# Patient Record
Sex: Female | Born: 1965 | Race: White | Hispanic: No | Marital: Married | Smoking: Former smoker
Health system: Southern US, Community
[De-identification: ages and names within clinical notes are randomized; demographics above are authoritative.]

## PROBLEM LIST (undated history)

## (undated) DIAGNOSIS — M1990 Unspecified osteoarthritis, unspecified site: Secondary | ICD-10-CM

## (undated) DIAGNOSIS — E119 Type 2 diabetes mellitus without complications: Secondary | ICD-10-CM

## (undated) DIAGNOSIS — G43909 Migraine, unspecified, not intractable, without status migrainosus: Secondary | ICD-10-CM

## (undated) DIAGNOSIS — M549 Dorsalgia, unspecified: Secondary | ICD-10-CM

## (undated) DIAGNOSIS — M542 Cervicalgia: Secondary | ICD-10-CM

## (undated) DIAGNOSIS — I10 Essential (primary) hypertension: Secondary | ICD-10-CM

## (undated) HISTORY — PX: ABDOMINAL HYSTERECTOMY: SHX81

## (undated) HISTORY — PX: APPENDECTOMY: SHX54

## (undated) HISTORY — PX: KNEE SURGERY: SHX244

## (undated) HISTORY — PX: OTHER SURGICAL HISTORY: SHX169

## (undated) HISTORY — PX: EXTERNAL EAR SURGERY: SHX627

## (undated) HISTORY — PX: KNEE RECONSTRUCTION: SHX5883

---

## 2014-12-20 ENCOUNTER — Encounter: Payer: Self-pay | Admitting: Emergency Medicine

## 2014-12-20 ENCOUNTER — Ambulatory Visit
Admission: EM | Admit: 2014-12-20 | Discharge: 2014-12-20 | Disposition: A | Discharge status: Home / Self Care | Payer: Medicare Other | Attending: Physician Assistant | Admitting: Physician Assistant

## 2014-12-20 ENCOUNTER — Ambulatory Visit: Payer: Medicare Other

## 2014-12-20 DIAGNOSIS — X58XXXA Exposure to other specified factors, initial encounter: Secondary | ICD-10-CM | POA: Diagnosis not present

## 2014-12-20 DIAGNOSIS — I10 Essential (primary) hypertension: Secondary | ICD-10-CM | POA: Diagnosis not present

## 2014-12-20 DIAGNOSIS — S63502A Unspecified sprain of left wrist, initial encounter: Secondary | ICD-10-CM | POA: Diagnosis not present

## 2014-12-20 DIAGNOSIS — Z79899 Other long term (current) drug therapy: Secondary | ICD-10-CM | POA: Insufficient documentation

## 2014-12-20 DIAGNOSIS — Z87891 Personal history of nicotine dependence: Secondary | ICD-10-CM | POA: Diagnosis not present

## 2014-12-20 DIAGNOSIS — E119 Type 2 diabetes mellitus without complications: Secondary | ICD-10-CM | POA: Diagnosis not present

## 2014-12-20 DIAGNOSIS — M25532 Pain in left wrist: Secondary | ICD-10-CM | POA: Diagnosis present

## 2014-12-20 DIAGNOSIS — Z794 Long term (current) use of insulin: Secondary | ICD-10-CM | POA: Insufficient documentation

## 2014-12-20 HISTORY — DX: Unspecified osteoarthritis, unspecified site: M19.90

## 2014-12-20 HISTORY — DX: Essential (primary) hypertension: I10

## 2014-12-20 HISTORY — DX: Migraine, unspecified, not intractable, without status migrainosus: G43.909

## 2014-12-20 HISTORY — DX: Cervicalgia: M54.2

## 2014-12-20 HISTORY — DX: Type 2 diabetes mellitus without complications: E11.9

## 2014-12-20 HISTORY — DX: Dorsalgia, unspecified: M54.9

## 2014-12-20 NOTE — Discharge Instructions (Signed)
Continuous velcro wrist wrap while active---off at rest and to sleep Wear wrap while traveling and handling suitcases Ice pak/frozen peas as discussed  Range of motion exercises fingers and hand  Wrist Pain There are many things that can cause wrist pain. Some common causes include:  An injury to the wrist area, such as a sprain, strain, or fracture.  Overuse of the joint.  A condition that causes increased pressure on a nerve in the wrist (carpal tunnel syndrome).  Wear and tear of the joints that occurs with aging (osteoarthritis).  A variety of other types of arthritis. Sometimes, the cause of wrist pain is not known. The pain often goes away when you follow your health care provider's instructions for relieving pain at home. If your wrist pain continues, tests may need to be done to diagnose your condition. HOME CARE INSTRUCTIONS Pay attention to any changes in your symptoms. Take these actions to help with your pain:  Rest the wrist area for at least 48 hours or as told by your health care provider.  If directed, apply ice to the injured area:  Put ice in a plastic bag.  Place a towel between your skin and the bag.  Leave the ice on for 20 minutes, 2-3 times per day.  Keep your arm raised (elevated) above the level of your heart while you are sitting or lying down.  If a splint or elastic bandage has been applied, use it as told by your health care provider.  Remove the splint or bandage only as told by your health care provider.  Loosen the splint or bandage if your fingers become numb or have a tingling feeling, or if they turn cold or blue.  Take over-the-counter and prescription medicines only as told by your health care provider.  Keep all follow-up visits as told by your health care provider. This is important. SEEK MEDICAL CARE IF:  Your pain is not helped by treatment.  Your pain gets worse. SEEK IMMEDIATE MEDICAL CARE IF:  Your fingers become  swollen.  Your fingers turn white, very red, or cold and blue.  Your fingers are numb or have a tingling feeling.  You have difficulty moving your fingers.   This information is not intended to replace advice given to you by your health care provider. Make sure you discuss any questions you have with your health care provider.   Document Released: 10/28/2004 Document Revised: 10/09/2014 Document Reviewed: 06/05/2014 Elsevier Interactive Patient Education [employer]2016 [employer] Inc[employer]

## 2014-12-20 NOTE — ED Notes (Addendum)
Wrist pain fell one week ago and was seen at [place] in [place]. Still have brace on wrist and would like it removed.
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
## 2017-05-03 IMAGING — CR DG WRIST COMPLETE 3+V*L*
4 series · 4 of 4 positions shown · non-contrast
Comparison: None.

CLINICAL DATA: Pt with FOOSH injury 12/11/14 with negative x-rays
of the left hand. Pt here today because she wants her brace off if
it's not broken. Left wrist swelling with ulnar/medial pain. Rule
out fracture.

EXAM:
LEFT WRIST - COMPLETE 3+ VIEW

[wrist pa (1 of 2)]
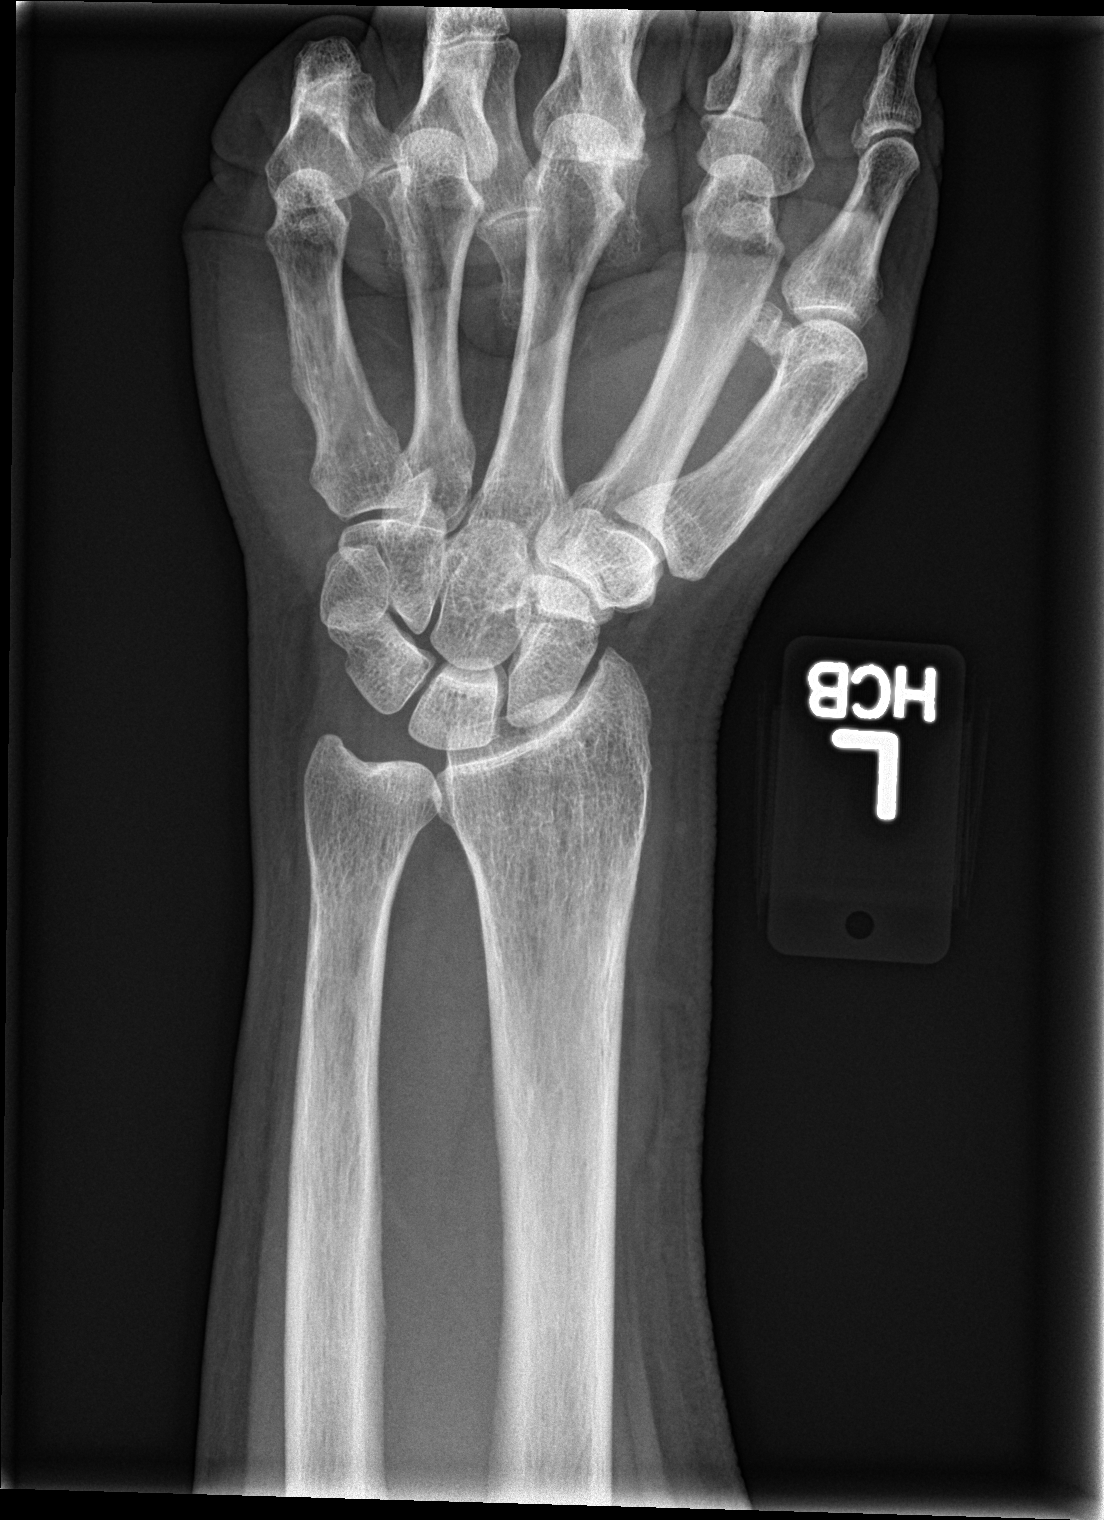

[wrist obl]
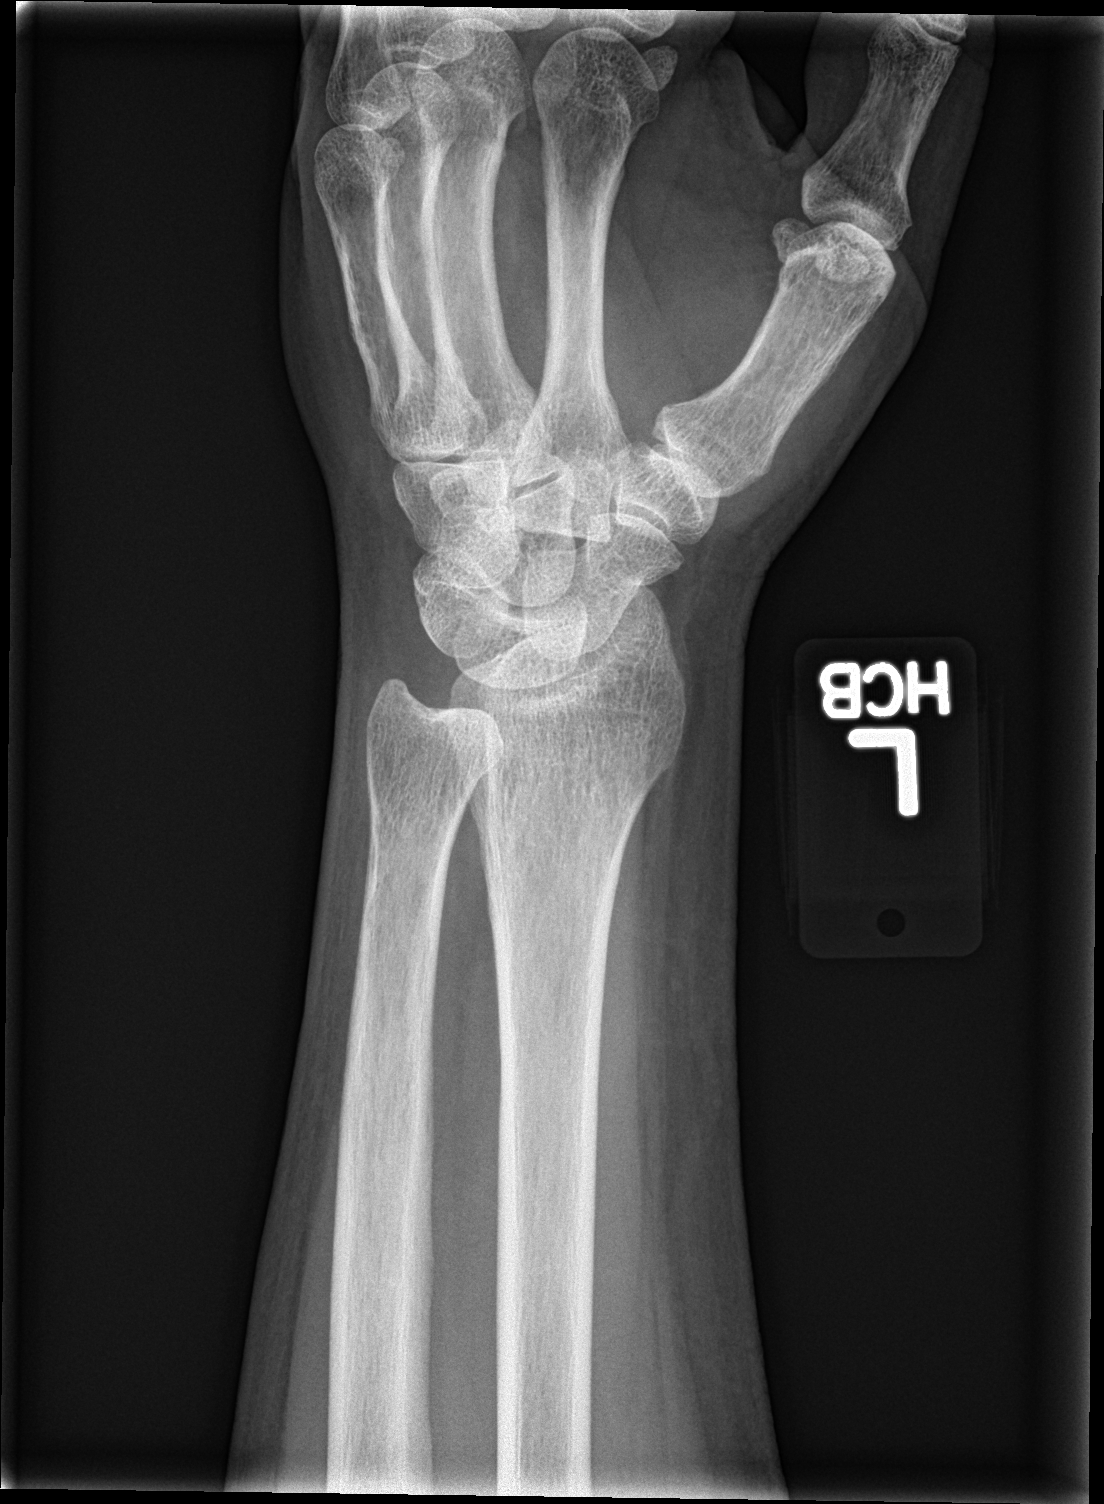

[wrist lat]
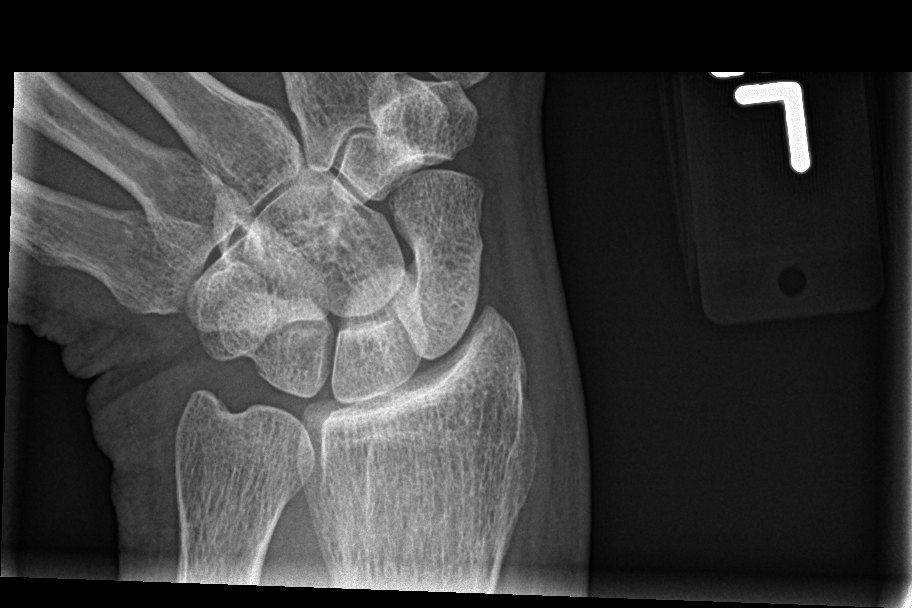

[wrist pa (2 of 2)]
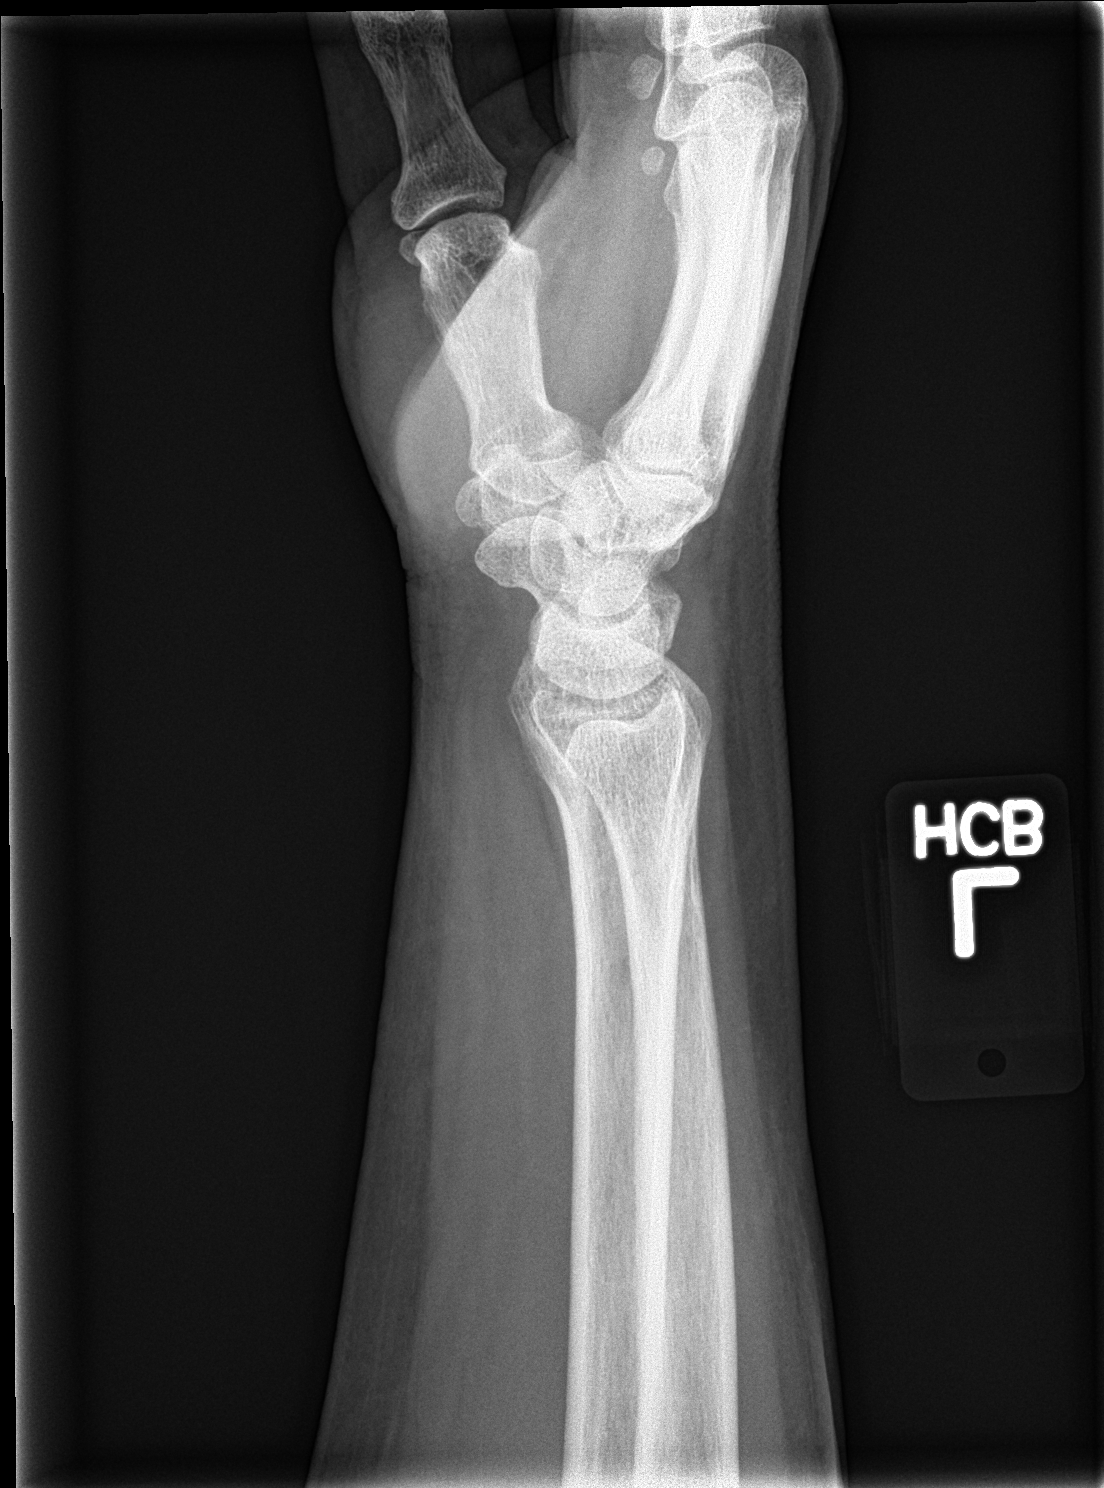

[4 of 4 positions shown; findings below may reference images not displayed]

FINDINGS: There is no evidence of fracture or dislocation. There is no
evidence of arthropathy or other focal bone abnormality. Soft
tissues are unremarkable.
IMPRESSION: Negative.
